# Patient Record
Sex: Male | Born: 1995
Health system: Southern US, Community
[De-identification: ages and names within clinical notes are randomized; demographics above are authoritative.]

## PROBLEM LIST (undated history)

## (undated) DIAGNOSIS — F341 Dysthymic disorder: Secondary | ICD-10-CM

## (undated) HISTORY — DX: Dysthymic disorder: F34.1

---
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
## 2018-02-13 ENCOUNTER — Ambulatory Visit: Payer: Self-pay | Admitting: Family Medicine

## 2018-03-18 ENCOUNTER — Encounter: Payer: Self-pay | Admitting: Family Medicine

## 2018-03-18 ENCOUNTER — Ambulatory Visit (INDEPENDENT_AMBULATORY_CARE_PROVIDER_SITE_OTHER): Payer: BLUE CROSS/BLUE SHIELD | Admitting: Family Medicine

## 2018-03-18 VITALS — BP 110/80 | HR 83 | Temp 97.9°F | Ht 70.5 in | Wt 150.2 lb

## 2018-03-18 DIAGNOSIS — E162 Hypoglycemia, unspecified: Secondary | ICD-10-CM

## 2018-03-18 DIAGNOSIS — Z23 Encounter for immunization: Secondary | ICD-10-CM | POA: Diagnosis not present

## 2018-03-18 DIAGNOSIS — R42 Dizziness and giddiness: Secondary | ICD-10-CM

## 2018-03-18 LAB — COMPREHENSIVE METABOLIC PANEL
ALT: 11 U/L (ref 0–53)
AST: 15 U/L (ref 0–37)
Albumin: 5 g/dL (ref 3.5–5.2)
Alkaline Phosphatase: 72 U/L (ref 39–117)
BUN: 13 mg/dL (ref 6–23)
CO2: 29 mEq/L (ref 19–32)
Calcium: 10.2 mg/dL (ref 8.4–10.5)
Chloride: 100 mEq/L (ref 96–112)
Creatinine, Ser: 1.06 mg/dL (ref 0.40–1.50)
GFR: 93.12 mL/min (ref >60.00)
Glucose, Bld: 90 mg/dL (ref 70–99)
Potassium: 4.4 mEq/L (ref 3.5–5.1)
Sodium: 139 mEq/L (ref 135–145)
TOTAL PROTEIN: 7.6 g/dL (ref 6.0–8.3)
Total Bilirubin: 1.3 mg/dL — ABNORMAL HIGH (ref 0.2–1.2)

## 2018-03-18 LAB — CBC
HCT: 48.2 % (ref 39.0–52.0)
HEMOGLOBIN: 16.5 g/dL (ref 13.0–17.0)
MCHC: 34.2 g/dL (ref 30.0–36.0)
MCV: 91.6 fl (ref 78.0–100.0)
Platelets: 251 10*3/uL (ref 150.0–400.0)
RBC: 5.26 Mil/uL (ref 4.22–5.81)
RDW: 13.2 % (ref 11.5–15.5)
WBC: 8.3 10*3/uL (ref 4.0–10.5)

## 2018-03-18 LAB — TSH: TSH: 1.72 u[IU]/mL (ref 0.35–4.50)

## 2018-03-18 NOTE — Assessment & Plan Note (Signed)
-  Unclear etiology of symptoms.  May be related to hypoglycemia.  -Discussed trying to be better about eating regular meals throughout the day.  -Focus on high protein foods -Labs ordered today as well.  Orders Placed This Encounter  Procedures  . Flu Vaccine QUAD 36+ mos IM  . Comp Met (CMET)  . TSH  . CBC  . Insulin, Free and Total

## 2018-03-18 NOTE — Patient Instructions (Signed)
Very nice to meet you today! We'll be in touch with lab results.    Protein Content in Foods Generally, most healthy people need around 50 grams of protein each day. Depending on your overall health, you may need more or less protein in your diet. Talk to your health care provider or dietitian about how much protein you need. See the following list for the protein content of some common foods. High-protein foods High-protein foods contain 4 grams (4 g) or more of protein per serving. They include:  Beef, ground sirloin (cooked) - 3 oz have 24 g of protein.  Cheese (hard) - 1 oz has 7 g of protein.  Chicken breast, boneless and skinless (cooked) - 3 oz have 13.4 g of protein.  Cottage cheese - 1/2 cup has 13.4 g of protein.  Egg - 1 egg has 6 g of protein.  Fish, filet (cooked) - 1 oz has 6-7 g of protein.  Garbanzo beans (canned or cooked) - 1/2 cup has 6-7 g of protein.  Kidney beans (canned or cooked) - 1/2 cup has 6-7 g of protein.  Lamb (cooked) - 3 oz has 24 g of protein.  Milk - 1 cup (8 oz) has 8 g of protein.  Nuts (peanuts, pistachios, almonds) - 1 oz has 6 g of protein.  Peanut butter - 1 oz has 7-8 g of protein.  Pork tenderloin (cooked) - 3 oz has 18.4 g of protein.  Pumpkin seeds - 1 oz has 8.5 g of protein.  Soybeans (roasted) - 1 oz has 8 g of protein.  Soybeans (cooked) - 1/2 cup has 11 g of protein.  Soy milk - 1 cup (8 oz) has 5-10 g of protein.  Soy or vegetable patty - 1 patty has 11 g of protein.  Sunflower seeds - 1 oz has 5.5 g of protein.  Tofu (firm) - 1/2 cup has 20 g of protein.  Tuna (canned in water) - 3 oz has 20 g of protein.  Yogurt - 6 oz has 8 g of protein.  Low-protein foods Low-protein foods contain 3 grams (3 g) or less of protein per serving. They include:  Beets (raw or cooked) - 1/2 cup has 1.5 g of protein.  Bran cereal - 1/2 cup has 2-3 g of protein.  Bread - 1 slice has 2.5 g of protein.  Broccoli (raw or  cooked) - 1/2 cup has 2 g of protein.  Collard greens (raw or cooked) - 1/2 cup has 2 g of protein.  Corn (fresh or cooked) - 1/2 cup has 2 g of protein.  Cream cheese - 1 oz has 2 g of protein.  Creamer (half-and-half) - 1 oz has 1 g of protein.  Flour tortilla - 1 tortilla has 2.5 g of protein  Frozen yogurt - 1/2 cup has 3 g of protein.  Fruit or vegetable juice - 1/2 cup has 1 g of protein.  Green beans (raw or cooked) - 1/2 cup has 1 g of protein.  Green peas (canned) - 1/2 cup has 3.5 g of protein.  Muffins - 1 small muffin (2 oz) has 3 g of protein.  Oatmeal (cooked) - 1/2 cup has 3 g of protein.  Potato (baked with skin) - 1 medium potato has 3 g of protein.  Rice (cooked) - 1/2 cup has 2.5-3.5 g of protein.  Sour cream - 1/2 cup has 2.5 g of protein.  Spinach (cooked) - 1/2 cup has 3 g of protein.  Squash (cooked) - 1/2 cup has 1.5 g of protein.  Actual amounts of protein may be different depending on processing. Talk with your health care provider or dietitian about what foods are recommended for you. This information is not intended to replace advice given to you by your health care provider. Make sure you discuss any questions you have with your health care provider. Document Released: 07/01/2015 Document Revised: 12/11/2015 Document Reviewed: 12/11/2015 Elsevier Interactive Patient Education  [employer].

## 2018-03-18 NOTE — Progress Notes (Signed)
Russell Washington - 22 y.o. male MRN [id number]  Date of birth: [birth year]  Subjective Chief Complaint  Patient presents with  . Hypoglycemia    HPI Russell Washington is a 22 y.o. male with history of dysthymia/anxiety here today to establish care with new pcp.  He is concerned about possible hypoglycemic symptoms.  He reports symptoms of feeling shaky, dizziness, and fatigue.  These usually improve with eating.  He reports that he doesn't always follow the best schedule when it comes to eating.  He typically does not eat breakfast and sometimes will skip lunch.  He does feel anxious at times as well.  He has found it difficult to put on weight.  He denies other symptoms including fever, chills, weight loss, nausea, vomiting, diarrhea, or abdominal pain.   ROS:  A comprehensive ROS was completed and negative except as noted per HPI  No Known Allergies  History reviewed. No pertinent past medical history.  History reviewed. No pertinent surgical history.  Social History   Socioeconomic History  . Marital status: Single    Spouse name: Not on file  . Number of children: Not on file  . Years of education: Not on file  . Highest education level: Not on file  Occupational History  . Not on file  Social Needs  . Financial resource strain: Not on file  . Food insecurity:    Worry: Not on file    Inability: Not on file  . Transportation needs:    Medical: Not on file    Non-medical: Not on file  Tobacco Use  . Smoking status: Never Smoker  . Smokeless tobacco: Never Used  Substance and Sexual Activity  . Alcohol use: Never    Frequency: Never  . Drug use: Never  . Sexual activity: Not on file  Lifestyle  . Physical activity:    Days per week: Not on file    Minutes per session: Not on file  . Stress: Not on file  Relationships  . Social connections:    Talks on phone: Not on file    Gets together: Not on file    Attends religious service: Not on file    Active member of  club or organization: Not on file    Attends meetings of clubs or organizations: Not on file    Relationship status: Not on file  Other Topics Concern  . Not on file  Social History Narrative  . Not on file    History reviewed. No pertinent family history.  Health Maintenance  Topic Date Due  . TETANUS/TDAP  07/09/2015  . INFLUENZA VACCINE  11/13/2017  . HIV Screening  03/19/2019 (Originally 07/09/2011)    ----------------------------------------------------------------------------------------------------------------------------------------------------------------------------------------------------------------- Physical Exam BP 110/80   Pulse 83   Temp 97.9 F (36.6 C) (Oral)   Ht 5' 10.5" (1.791 m)   Wt 150 lb 3.2 oz (68.1 kg)   SpO2 99%   BMI 21.25 kg/m   Physical Exam  Constitutional: He is oriented to person, place, and time. He appears well-nourished. No distress.  HENT:  Head: Normocephalic and atraumatic.  Mouth/Throat: Oropharynx is clear and moist.  Eyes: No scleral icterus.  Neck: Neck supple. No thyromegaly present.  Cardiovascular: Normal rate, regular rhythm and normal heart sounds.  Pulmonary/Chest: Effort normal and breath sounds normal.  Abdominal: Soft. Bowel sounds are normal.  Lymphadenopathy:    He has no cervical adenopathy.  Neurological: He is alert and oriented to person, place, and time.  Skin: Skin is warm  and dry.  Psychiatric: He has a normal mood and affect. His behavior is normal.    ------------------------------------------------------------------------------------------------------------------------------------------------------------------------------------------------------------------- Assessment and Plan  Hypoglycemia -Unclear etiology of symptoms.  May be related to hypoglycemia.  -Discussed trying to be better about eating regular meals throughout the day.  -Focus on high protein foods -Labs ordered today as well.  Orders  Placed This Encounter  Procedures  . Flu Vaccine QUAD 36+ mos IM  . Comp Met (CMET)  . TSH  . CBC  . Insulin, Free and Total

## 2018-03-22 LAB — INSULIN, FREE AND TOTAL
Free Insulin: 7.3 uU/mL
Total Insulin: 7.3 uU/mL

## 2018-03-25 ENCOUNTER — Telehealth: Payer: Self-pay | Admitting: Family Medicine

## 2018-03-25 NOTE — Telephone Encounter (Signed)
Copied from CRM [phone]. Topic: General - Other >> Mar 25, 2018 12:24 PM Lynne LoganHudson, Caryn D wrote: Reason for CRM: Pt called back to get lab results. NT unavailable. Please contact pt. [phone]#[phone]

## 2018-03-30 NOTE — Telephone Encounter (Signed)
Patient calling back to receive lab results. NT unavailable. Please contact patient.

## 2018-03-31 NOTE — Telephone Encounter (Signed)
Attempted to call patient about his lab results- left message to call back

## 2018-04-21 ENCOUNTER — Other Ambulatory Visit: Payer: Self-pay | Admitting: Family Medicine

## 2018-04-21 ENCOUNTER — Ambulatory Visit (INDEPENDENT_AMBULATORY_CARE_PROVIDER_SITE_OTHER): Payer: BLUE CROSS/BLUE SHIELD | Admitting: Family Medicine

## 2018-04-21 ENCOUNTER — Ambulatory Visit (INDEPENDENT_AMBULATORY_CARE_PROVIDER_SITE_OTHER): Payer: BLUE CROSS/BLUE SHIELD

## 2018-04-21 ENCOUNTER — Encounter: Payer: Self-pay | Admitting: Family Medicine

## 2018-04-21 VITALS — BP 128/72 | HR 66 | Temp 98.2°F | Ht 70.5 in | Wt 150.0 lb

## 2018-04-21 DIAGNOSIS — M25572 Pain in left ankle and joints of left foot: Secondary | ICD-10-CM

## 2018-04-21 DIAGNOSIS — Z0000 Encounter for general adult medical examination without abnormal findings: Secondary | ICD-10-CM | POA: Diagnosis not present

## 2018-04-21 DIAGNOSIS — S8252XA Displaced fracture of medial malleolus of left tibia, initial encounter for closed fracture: Secondary | ICD-10-CM | POA: Diagnosis not present

## 2018-04-21 DIAGNOSIS — S8255XA Nondisplaced fracture of medial malleolus of left tibia, initial encounter for closed fracture: Secondary | ICD-10-CM

## 2018-04-21 NOTE — Patient Instructions (Signed)

## 2018-04-21 NOTE — Assessment & Plan Note (Signed)
-  Xrays ordered for further evaluation -He may continue boot and crutches for now.

## 2018-04-21 NOTE — Progress Notes (Signed)
Russell Washington - 23 y.o. male MRN [id number]  Date of birth: Oct 24, 1995  Subjective Chief Complaint  Patient presents with  . Annual Exam    fell snowboarding on 04/16/18-lower left ankle pain    HPI Russell Washington is a 23 y.o. male here today for annual exam.  He also has complaint of L ankle pain.  Reports fall while snowboarding on 04/16/2018.  Did not seek care for this initially and friend had aircast boot and crutches which he has been using since initial injury.  He reports swelling and pain have improved some and he is able to weight bear some over the past day or two.  He denies numbness or tingling of the foot.    Review of Systems  Constitutional: Negative for chills, fever, malaise/fatigue and weight loss.  HENT: Negative for congestion, ear pain and sore throat.   Eyes: Negative for blurred vision, double vision and pain.  Respiratory: Negative for cough and shortness of breath.   Cardiovascular: Negative for chest pain and palpitations.  Gastrointestinal: Negative for abdominal pain, blood in stool, constipation, heartburn and nausea.  Genitourinary: Negative for dysuria and urgency.  Musculoskeletal: Positive for joint pain. Negative for myalgias.  Neurological: Negative for dizziness and headaches.  Endo/Heme/Allergies: Does not bruise/bleed easily.  Psychiatric/Behavioral: Negative for depression. The patient is not nervous/anxious and does not have insomnia.      No Known Allergies  Past Medical History:  Diagnosis Date  . Dysthymia     No past surgical history on file.  Social History   Socioeconomic History  . Marital status: Single    Spouse name: Not on file  . Number of children: Not on file  . Years of education: Not on file  . Highest education level: Not on file  Occupational History  . Not on file  Social Needs  . Financial resource strain: Not on file  . Food insecurity:    Worry: Not on file    Inability: Not on file  . Transportation  needs:    Medical: Not on file    Non-medical: Not on file  Tobacco Use  . Smoking status: Never Smoker  . Smokeless tobacco: Never Used  Substance and Sexual Activity  . Alcohol use: Never    Frequency: Never  . Drug use: Never  . Sexual activity: Not on file  Lifestyle  . Physical activity:    Days per week: Not on file    Minutes per session: Not on file  . Stress: Not on file  Relationships  . Social connections:    Talks on phone: Not on file    Gets together: Not on file    Attends religious service: Not on file    Active member of club or organization: Not on file    Attends meetings of clubs or organizations: Not on file    Relationship status: Not on file  Other Topics Concern  . Not on file  Social History Narrative  . Not on file    No family history on file.  Health Maintenance  Topic Date Due  . TETANUS/TDAP  07/09/2015  . HIV Screening  03/19/2019 (Originally 07/09/2011)  . INFLUENZA VACCINE  Completed    ----------------------------------------------------------------------------------------------------------------------------------------------------------------------------------------------------------------- Physical Exam BP 128/72   Pulse 66   Temp 98.2 F (36.8 C) (Oral)   Ht 5' 10.5" (1.791 m)   Wt 150 lb (68 kg)   SpO2 98%   BMI 21.22 kg/m   Physical Exam Constitutional:  General: He is not in acute distress. HENT:     Head: Normocephalic and atraumatic.     Right Ear: External ear normal.     Left Ear: External ear normal.     Mouth/Throat:     Mouth: Mucous membranes are moist.  Eyes:     General: No scleral icterus. Neck:     Musculoskeletal: Normal range of motion.     Thyroid: No thyromegaly.  Cardiovascular:     Rate and Rhythm: Normal rate and regular rhythm.     Heart sounds: Normal heart sounds.  Pulmonary:     Effort: Pulmonary effort is normal.     Breath sounds: Normal breath sounds.  Abdominal:     General:  Bowel sounds are normal. There is no distension.     Palpations: Abdomen is soft.     Tenderness: There is no abdominal tenderness. There is no guarding.  Musculoskeletal:     Comments: Mild swelling and bruising noted along medial ankle.  TTP along medial malleolus without deformity.  Pain with eversion/inversion.  Pain with weight bearing.   Lymphadenopathy:     Cervical: No cervical adenopathy.  Skin:    General: Skin is warm and dry.     Findings: No rash.  Neurological:     Mental Status: He is alert and oriented to person, place, and time.     Cranial Nerves: No cranial nerve deficit.     Motor: No abnormal muscle tone.  Psychiatric:        Mood and Affect: Mood normal.        Behavior: Behavior normal.     ------------------------------------------------------------------------------------------------------------------------------------------------------------------------------------------------------------------- Assessment and Plan  Acute left ankle pain -Xrays ordered for further evaluation -He may continue boot and crutches for now.     Well adult exam Well adult Previous labs reviewed Immunizations: Up to date Screenings: none indicated Anticipatory guidance/Risk factor reduction:  Per AVS

## 2018-04-21 NOTE — Assessment & Plan Note (Signed)
Well adult Previous labs reviewed Immunizations: Up to date Screenings: none indicated Anticipatory guidance/Risk factor reduction:  Per AVS

## 2018-04-22 ENCOUNTER — Telehealth: Payer: Self-pay

## 2018-04-22 NOTE — Telephone Encounter (Signed)
Spoke with patient-given results. Answered questions-he verbalized understanding

## 2018-04-22 NOTE — Telephone Encounter (Signed)
Left VM to return call 

## 2018-04-22 NOTE — Telephone Encounter (Signed)
Copied from CRM [phone] [phone]. Topic: General - Other >> Apr 21, 2018  5:11 PM Jilda Roche wrote: Reason for CRM: Pt called back said he had a message from [place] at the office, tried to call office already closed  Best call back is [phone]

## 2018-04-23 ENCOUNTER — Encounter (INDEPENDENT_AMBULATORY_CARE_PROVIDER_SITE_OTHER): Payer: Self-pay | Admitting: Orthopedic Surgery

## 2018-04-23 ENCOUNTER — Ambulatory Visit (INDEPENDENT_AMBULATORY_CARE_PROVIDER_SITE_OTHER): Payer: BLUE CROSS/BLUE SHIELD | Admitting: Orthopedic Surgery

## 2018-04-23 VITALS — Ht 70.0 in | Wt 150.0 lb

## 2018-04-23 DIAGNOSIS — S8255XA Nondisplaced fracture of medial malleolus of left tibia, initial encounter for closed fracture: Secondary | ICD-10-CM | POA: Diagnosis not present

## 2018-04-26 ENCOUNTER — Encounter (INDEPENDENT_AMBULATORY_CARE_PROVIDER_SITE_OTHER): Payer: Self-pay | Admitting: Orthopedic Surgery

## 2018-04-26 DIAGNOSIS — S8255XA Nondisplaced fracture of medial malleolus of left tibia, initial encounter for closed fracture: Secondary | ICD-10-CM | POA: Insufficient documentation

## 2018-04-26 NOTE — Progress Notes (Signed)
   Office Visit Note   Patient: Russell Washington           Date of Birth: 1996/02/20           MRN: [id number] Visit Date: 04/23/2018              Requested by: Everrett Coombe, DO [address] [place], [place] [address] PCP: Everrett Coombe, DO  Chief Complaint  Patient presents with  . Left Ankle - Pain    04/16/2018 snowboarding accident.       HPI: Patient is a 23 year old gentleman who sustained a snowboarding accident on January 2.  Patient had radiographs obtained on January 7 which showed a nondisplaced medial malleolar fracture.  Patient is seen today for initial evaluation he is nonweightbearing with crutches and a fracture boot.  Patient does not smoke.  No medications no medical problems.  Assessment & Plan: Visit Diagnoses:  1. Closed nondisplaced fracture of medial malleolus of left tibia, initial encounter     Plan: Discussed with the patient that since his radiographs a week out showed no displacement I feel this should heal well without intervention.  Recommended continue nonweightbearing use the fracture boot and crutches.  Follow-up in 3 weeks repeat three-view radiographs of the left ankle at which time anticipate we can begin weightbearing as tolerated in the boot.  Follow-Up Instructions: Return in about 3 weeks (around 05/14/2018).   Ortho Exam  Patient is alert, oriented, no adenopathy, well-dressed, normal affect, normal respiratory effort. Examination patient has a good pulse the proximal tibia and fibula are nontender to palpation the syndesmosis is nontender to palpation.  The lateral ankle ligaments are minimally tender to palpation anterior drawer is stable review of the radiographs shows a nondisplaced medial malleolar fracture.  Imaging: No results found. No images are attached to the encounter.  Labs: No results found for: HGBA1C, ESRSEDRATE, CRP, LABURIC, REPTSTATUS, GRAMSTAIN, CULT, LABORGA   Lab Results  Component Value Date   ALBUMIN 5.0 03/18/2018    Body mass index is 21.52 kg/m.  Orders:  No orders of the defined types were placed in this encounter.  No orders of the defined types were placed in this encounter.    Procedures: No procedures performed  Clinical Data: No additional findings.  ROS:  All other systems negative, except as noted in the HPI. Review of Systems  Objective: Vital Signs: Ht 5\' 10"  (1.778 m)   Wt 150 lb (68 kg)   BMI 21.52 kg/m   Specialty Comments:  No specialty comments available.  PMFS History: Patient Active Problem List   Diagnosis Date Noted  . Closed nondisplaced fracture of medial malleolus of left tibia 04/26/2018  . Acute left ankle pain 04/21/2018  . Well adult exam 04/21/2018  . Hypoglycemia 03/18/2018   Past Medical History:  Diagnosis Date  . Dysthymia     History reviewed. No pertinent family history.  History reviewed. No pertinent surgical history. Social History   Occupational History  . Not on file  Tobacco Use  . Smoking status: Never Smoker  . Smokeless tobacco: Never Used  Substance and Sexual Activity  . Alcohol use: Never    Frequency: Never  . Drug use: Never  . Sexual activity: Not on file

## 2018-05-14 ENCOUNTER — Ambulatory Visit (INDEPENDENT_AMBULATORY_CARE_PROVIDER_SITE_OTHER): Payer: BLUE CROSS/BLUE SHIELD | Admitting: Orthopedic Surgery

## 2018-05-14 ENCOUNTER — Encounter (INDEPENDENT_AMBULATORY_CARE_PROVIDER_SITE_OTHER): Payer: Self-pay | Admitting: Orthopedic Surgery

## 2018-05-14 ENCOUNTER — Ambulatory Visit (INDEPENDENT_AMBULATORY_CARE_PROVIDER_SITE_OTHER): Payer: Self-pay

## 2018-05-14 VITALS — Ht 70.0 in | Wt 150.0 lb

## 2018-05-14 DIAGNOSIS — S8255XA Nondisplaced fracture of medial malleolus of left tibia, initial encounter for closed fracture: Secondary | ICD-10-CM

## 2018-05-14 DIAGNOSIS — M25872 Other specified joint disorders, left ankle and foot: Secondary | ICD-10-CM

## 2018-05-14 MED ORDER — METHYLPREDNISOLONE ACETATE 40 MG/ML IJ SUSP
40.0000 mg | INTRAMUSCULAR | Status: AC | PRN
  Administered 2018-05-14: 40 mg via INTRA_ARTICULAR

## 2018-05-14 MED ORDER — LIDOCAINE HCL 1 % IJ SOLN
2.0000 mL | INTRAMUSCULAR | Status: AC | PRN
  Administered 2018-05-14: 2 mL

## 2018-05-14 NOTE — Progress Notes (Signed)
Office Visit Note   Patient: Russell Washington           Date of Birth: [birth year]           MRN: [id number] Visit Date: 05/14/2018              Requested by: Everrett Coombe, DO [address] [place], [place] [address] PCP: Everrett Coombe, DO  Chief Complaint  Patient presents with  . Left Ankle - Pain, Follow-up      HPI: Patient is a 23 year old gentleman who is status post a snowboarding accident about 4 weeks ago.  Patient has been nonweightbearing in a fracture boot initial radiographs showed a nondisplaced fracture through the medial malleolus.  Patient states the bone at this time is not painful but he does have pain over the deltoid ligaments.  He can weight-bear without difficulty but has pain with walking.  Assessment & Plan: Visit Diagnoses:  1. Closed nondisplaced fracture of medial malleolus of left tibia, initial encounter   2. Impingement syndrome of left ankle     Plan: Patient seems to be symptomatic from impingement of the deltoid ligament at this time ankle was injected he will increase his activities as tolerated patient was given instructions for isometric strengthening of the ankle.  Follow-Up Instructions: Return if symptoms worsen or fail to improve.   Ortho Exam  Patient is alert, oriented, no adenopathy, well-dressed, normal affect, normal respiratory effort. Examination patient has good pulse there is good ankle and subtalar motion no dystrophic changes.  The proximal tibia and fibula are nontender to palpation the syndesmosis is nontender to palpation the medial and lateral malleolus are nontender to palpation.  The lateral ankle ligaments are nontender to palpation anterior drawer is stable he is point tender to palpation over the anterior medial joint line with some mild tenderness over the deltoid with persistent symptoms from tear of the deltoid and most likely impingement from the deltoid tear.  Imaging: Xr Ankle Complete Left  Result  Date: 05/14/2018 3 view radiographs of the left ankle shows stable alignment of the medial and lateral malleolus no displacement no fracture lines visible.  The joint is congruent.  No images are attached to the encounter.  Labs: No results found for: HGBA1C, ESRSEDRATE, CRP, LABURIC, REPTSTATUS, GRAMSTAIN, CULT, LABORGA   Lab Results  Component Value Date   ALBUMIN 5.0 03/18/2018    Body mass index is 21.52 kg/m.  Orders:  Orders Placed This Encounter  Procedures  . XR Ankle Complete Left   No orders of the defined types were placed in this encounter.    Procedures: Medium Joint Inj: L ankle on 05/14/2018 10:54 AM Indications: pain and diagnostic evaluation Details: 22 G 1.5 in needle, anteromedial approach Medications: 2 mL lidocaine 1 %; 40 mg methylPREDNISolone acetate 40 MG/ML Outcome: tolerated well, no immediate complications Procedure, treatment alternatives, risks and benefits explained, specific risks discussed. Consent was given by the patient. Immediately prior to procedure a time out was called to verify the correct patient, procedure, equipment, support staff and site/side marked as required. Patient was prepped and draped in the usual sterile fashion.      Clinical Data: No additional findings.  ROS:  All other systems negative, except as noted in the HPI. Review of Systems  Objective: Vital Signs: Ht 5\' 10"  (1.778 m)   Wt 150 lb (68 kg)   BMI 21.52 kg/m   Specialty Comments:  No specialty comments available.  PMFS History: Patient Active Problem  List   Diagnosis Date Noted  . Closed nondisplaced fracture of medial malleolus of left tibia 04/26/2018  . Acute left ankle pain 04/21/2018  . Well adult exam 04/21/2018  . Hypoglycemia 03/18/2018   Past Medical History:  Diagnosis Date  . Dysthymia     History reviewed. No pertinent family history.  History reviewed. No pertinent surgical history. Social History   Occupational History  .  Not on file  Tobacco Use  . Smoking status: Never Smoker  . Smokeless tobacco: Never Used  Substance and Sexual Activity  . Alcohol use: Never    Frequency: Never  . Drug use: Never  . Sexual activity: Not on file

## 2019-03-30 DIAGNOSIS — Z20828 Contact with and (suspected) exposure to other viral communicable diseases: Secondary | ICD-10-CM | POA: Diagnosis not present

## 2019-04-14 DIAGNOSIS — Z20828 Contact with and (suspected) exposure to other viral communicable diseases: Secondary | ICD-10-CM | POA: Diagnosis not present

## 2019-04-14 IMAGING — DX DG ANKLE COMPLETE 3+V*L*
3 series · 3 of 3 positions shown · non-contrast
Comparison: None.

CLINICAL DATA: Initial visit for left ankle pain following a fall
while snowboarding 5 days ago. Pt states pain is more medial

EXAM:
LEFT ANKLE COMPLETE - 3+ VIEW

[ankle ap]
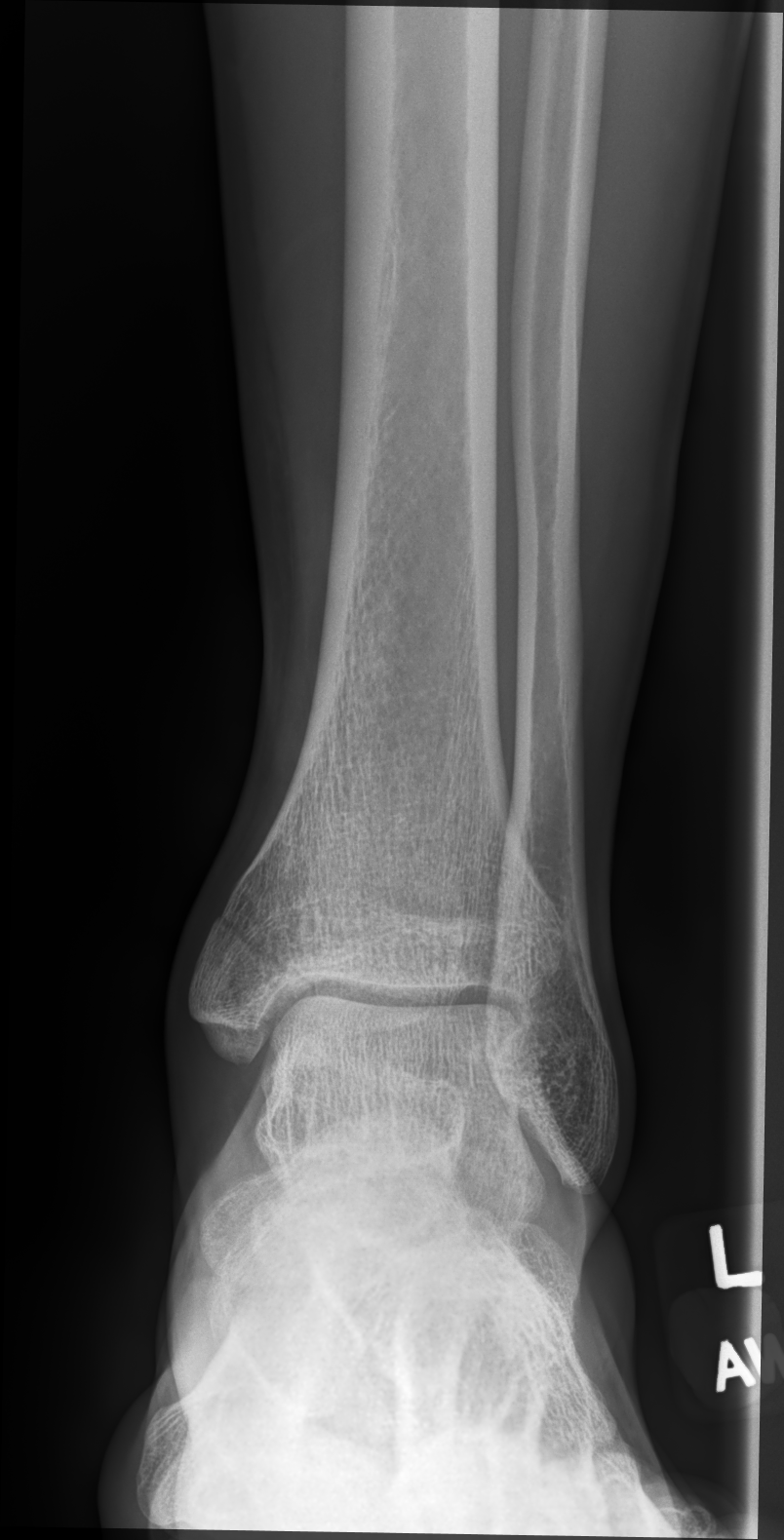

[ankle mlo]
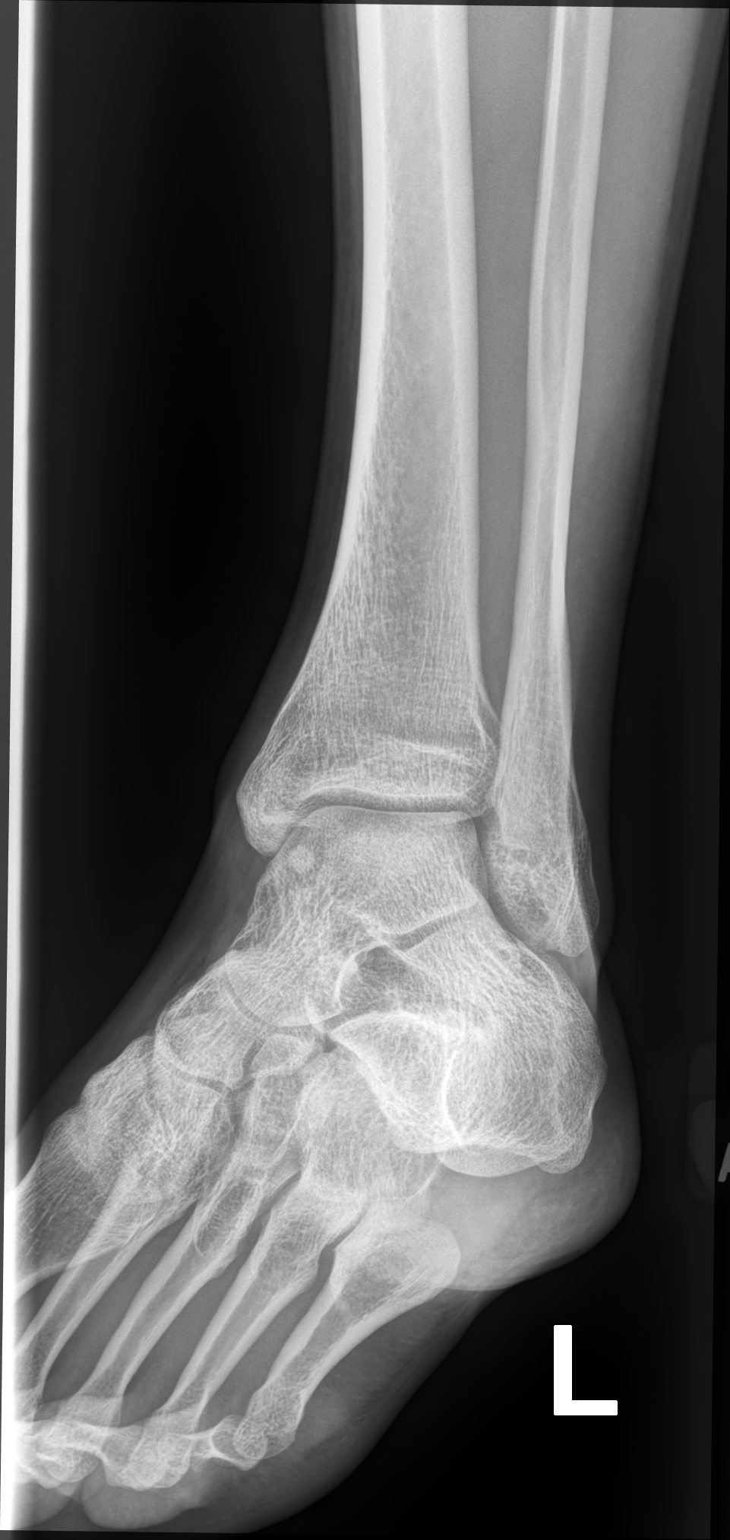

[ankle lat]
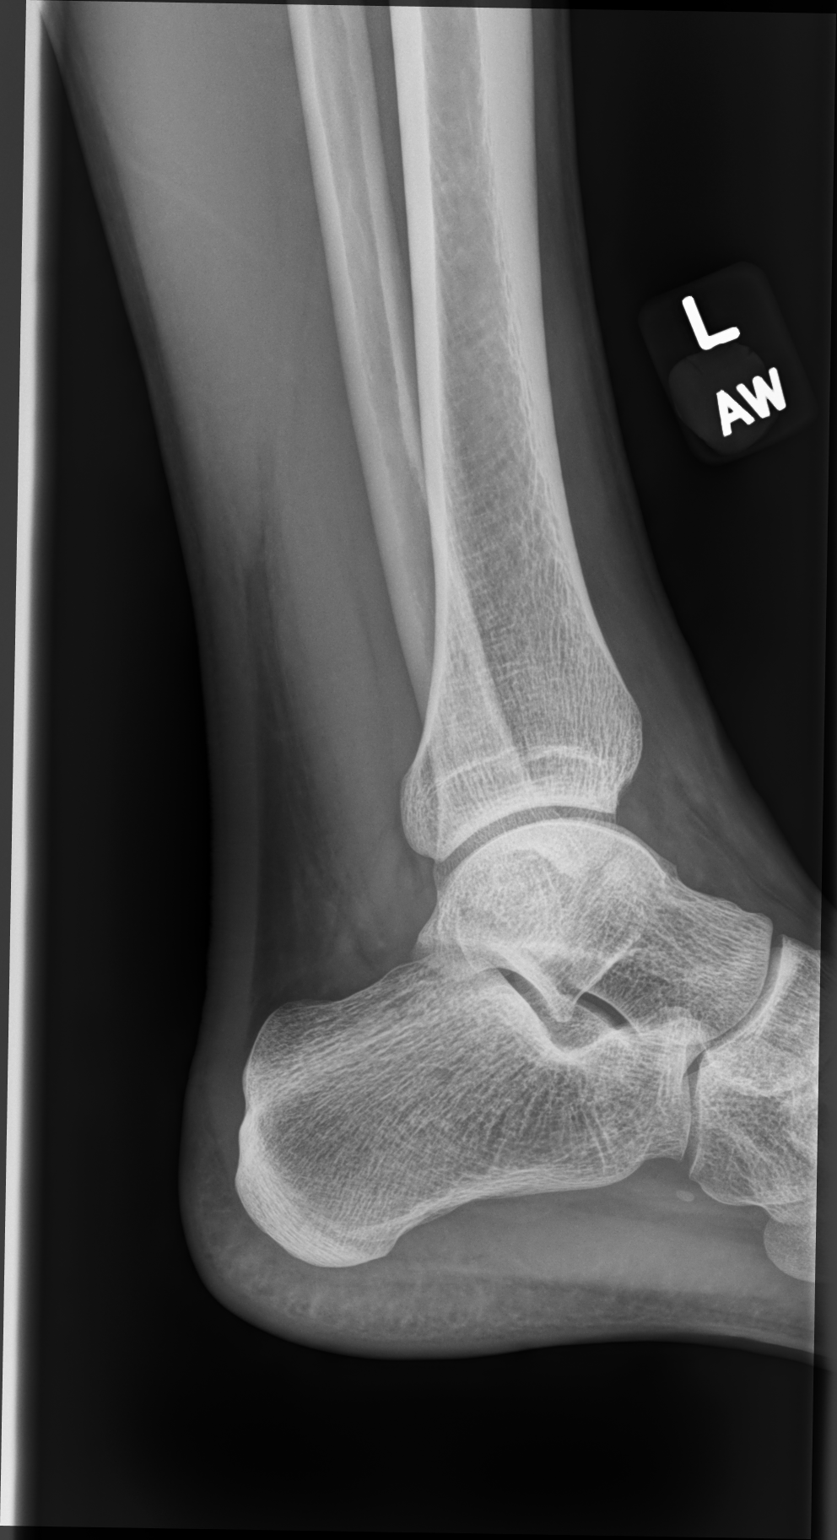

[3 of 3 positions shown; findings below may reference images not displayed]

FINDINGS: Oblique fracture across the medial malleolus, distracted
approximately 1 mm. Mild adjacent soft tissue swelling. No other
acute bone abnormalities. Ankle mortise preserved. Normal
mineralization. No significant osseous degenerative change.
IMPRESSION: Minimally distracted medial malleolar fracture.

## 2019-08-27 ENCOUNTER — Encounter: Payer: Self-pay | Admitting: Family Medicine

## 2019-08-27 ENCOUNTER — Telehealth (INDEPENDENT_AMBULATORY_CARE_PROVIDER_SITE_OTHER): Payer: BC Managed Care – PPO | Admitting: Family Medicine

## 2019-08-27 VITALS — Ht 70.0 in | Wt 143.0 lb

## 2019-08-27 DIAGNOSIS — J301 Allergic rhinitis due to pollen: Secondary | ICD-10-CM | POA: Insufficient documentation

## 2019-08-27 DIAGNOSIS — J0100 Acute maxillary sinusitis, unspecified: Secondary | ICD-10-CM | POA: Insufficient documentation

## 2019-08-27 MED ORDER — SALINE SPRAY 0.65 % NA SOLN: 1.0000 | NASAL | 2 refills | Status: DC | PRN

## 2019-08-27 MED ORDER — MOMETASONE FUROATE 50 MCG/ACT NA SUSP: 2.0000 | Freq: Every day | NASAL | 2 refills | Status: DC

## 2019-08-27 MED ORDER — AMOXICILLIN 500 MG PO CAPS: 500.0000 mg | ORAL_CAPSULE | Freq: Three times a day (TID) | ORAL | 0 refills | Status: DC

## 2019-08-27 NOTE — Progress Notes (Signed)
Established Patient Office Visit  Subjective:  Patient ID: Russell Washington, male    DOB: June 07, 1995  Age: 24 y.o. MRN: [id number]  CC:  Chief Complaint  Patient presents with  . Sinusitis    sinus pressure, nasal congestion, thick green mucus x 3-4 weeks.     HPI Russell Washington presents for evaluation and treatment of possible sinus infection.  Symptoms have persisted for 3 to 4 weeks.  He is now with facial pressure postnasal drip nasal congestion and purulent rhinorrhea.  He has had a mild frontal headache.  There is been no fever chills significant cough or wheezing.  Ongoing history of springtime allergies.  He asks if there is something he can do to prevent this.  He does not smoke. On a side note he tells that he experiences extreme facial flushing when he goes out and consumes alcohol with his friends.  He is not sure what it is that is from the Ramar line.  There is no difficulty breathing or swelling in his throat.  The flushing resolves after a few hours.  Past Medical History:  Diagnosis Date  . Dysthymia     No past surgical history on file.  No family history on file.  Social History   Socioeconomic History  . Marital status: Single    Spouse name: Not on file  . Number of children: Not on file  . Years of education: Not on file  . Highest education level: Not on file  Occupational History  . Not on file  Tobacco Use  . Smoking status: Never Smoker  . Smokeless tobacco: Never Used  Substance and Sexual Activity  . Alcohol use: Never  . Drug use: Never  . Sexual activity: Not on file  Other Topics Concern  . Not on file  Social History Narrative  . Not on file   Social Determinants of Health   Financial Resource Strain:   . Difficulty of Paying Living Expenses:   Food Insecurity:   . Worried About Programme researcher, broadcasting/film/video in the Last Year:   . Barista in the Last Year:   Transportation Needs:   . Freight forwarder (Medical):   Marland Kitchen Lack of  Transportation (Non-Medical):   Physical Activity:   . Days of Exercise per Week:   . Minutes of Exercise per Session:   Stress:   . Feeling of Stress :   Social Connections:   . Frequency of Communication with Friends and Family:   . Frequency of Social Gatherings with Friends and Family:   . Attends Religious Services:   . Active Member of Clubs or Organizations:   . Attends Banker Meetings:   Marland Kitchen Marital Status:   Intimate Partner Violence:   . Fear of Current or Ex-Partner:   . Emotionally Abused:   Marland Kitchen Physically Abused:   . Sexually Abused:     No outpatient medications prior to visit.   No facility-administered medications prior to visit.    No Known Allergies  ROS Review of Systems  Constitutional: Negative for chills, diaphoresis, fatigue, fever and unexpected weight change.  HENT: Positive for congestion, postnasal drip, rhinorrhea, sinus pressure and sinus pain. Negative for sneezing and voice change.   Eyes: Negative for photophobia and visual disturbance.  Respiratory: Negative.   Cardiovascular: Negative.   Gastrointestinal: Negative.   Musculoskeletal: Negative for gait problem and joint swelling.  Neurological: Positive for headaches.  Hematological: Does not bruise/bleed easily.  Psychiatric/Behavioral: Negative.  Objective:    Physical Exam  Constitutional: He appears well-developed and well-nourished. No distress.  HENT:  Head: Normocephalic and atraumatic.  Right Ear: External ear normal.  Left Ear: External ear normal.  Eyes: Conjunctivae are normal. Right eye exhibits no discharge. Left eye exhibits no discharge. No scleral icterus.  Neck: No JVD present. No tracheal deviation present.  Pulmonary/Chest: Effort normal. No stridor.  Neurological: He is alert.  Skin: Skin is warm and dry. He is not diaphoretic.  Psychiatric: He has a normal mood and affect. His behavior is normal.    Ht 5\' 10"  (1.778 m)   Wt 143 lb (64.9 kg)  Comment: per pt  BMI 20.52 kg/m  Wt Readings from Last 3 Encounters:  08/27/19 143 lb (64.9 kg)  05/14/18 150 lb (68 kg)  04/23/18 150 lb (68 kg)     Health Maintenance Due  Topic Date Due  . HIV Screening  Never done  . TETANUS/TDAP  Never done    There are no preventive care reminders to display for this patient.  Lab Results  Component Value Date   TSH 1.72 03/18/2018   Lab Results  Component Value Date   WBC 8.3 03/18/2018   HGB 16.5 03/18/2018   HCT 48.2 03/18/2018   MCV 91.6 03/18/2018   PLT 251.0 03/18/2018   Lab Results  Component Value Date   NA 139 03/18/2018   K 4.4 03/18/2018   CO2 29 03/18/2018   GLUCOSE 90 03/18/2018   BUN 13 03/18/2018   CREATININE 1.06 03/18/2018   BILITOT 1.3 (H) 03/18/2018   ALKPHOS 72 03/18/2018   AST 15 03/18/2018   ALT 11 03/18/2018   PROT 7.6 03/18/2018   ALBUMIN 5.0 03/18/2018   CALCIUM 10.2 03/18/2018   GFR 93.12 03/18/2018   No results found for: CHOL No results found for: HDL No results found for: LDLCALC No results found for: TRIG No results found for: CHOLHDL No results found for: HGBA1C    Assessment & Plan:   Problem List Items Addressed This Visit      Respiratory   Acute maxillary sinusitis - Primary   Relevant Medications   amoxicillin (AMOXIL) 500 MG capsule   mometasone (NASONEX) 50 MCG/ACT nasal spray   sodium chloride (OCEAN) 0.65 % SOLN nasal spray   Seasonal allergic rhinitis due to pollen   Relevant Medications   mometasone (NASONEX) 50 MCG/ACT nasal spray   sodium chloride (OCEAN) 0.65 % SOLN nasal spray      Meds ordered this encounter  Medications  . amoxicillin (AMOXIL) 500 MG capsule    Sig: Take 1 capsule (500 mg total) by mouth 3 (three) times daily.    Dispense:  30 capsule    Refill:  0  . mometasone (NASONEX) 50 MCG/ACT nasal spray    Sig: Place 2 sprays into the nose daily.    Dispense:  17 g    Refill:  2  . sodium chloride (OCEAN) 0.65 % SOLN nasal spray    Sig:  Place 1 spray into both nostrils as needed for congestion.    Dispense:  104 mL    Refill:  2    Follow-up: Return if symptoms worsen or fail to improve.   Will return fasting for physical exam.  They discussed the reaction he had as noted above.   Libby Maw, MD   Virtual Visit via Video Note  I connected with Russell Washington on 08/27/19 at 10:00 AM EDT by a video enabled  telemedicine application and verified that I am speaking with the correct person using two identifiers.  Location: Patient: Home alone in room.  Provider:   I discussed the limitations of evaluation and management by telemedicine and the availability of in person appointments. The patient expressed understanding and agreed to proceed.  History of Present Illness:    Observations/Objective:   Assessment and Plan:   Follow Up Instructions:    I discussed the assessment and treatment plan with the patient. The patient was provided an opportunity to ask questions and all were answered. The patient agreed with the plan and demonstrated an understanding of the instructions.   The patient was advised to call back or seek an in-person evaluation if the symptoms worsen or if the condition fails to improve as anticipated.  I provided 23 minutes of non-face-to-face time during this encounter.   Mliss Sax, MD

## 2019-10-22 DIAGNOSIS — Z20822 Contact with and (suspected) exposure to covid-19: Secondary | ICD-10-CM | POA: Diagnosis not present

## 2020-02-14 DIAGNOSIS — Z20822 Contact with and (suspected) exposure to covid-19: Secondary | ICD-10-CM | POA: Diagnosis not present

## 2020-02-14 DIAGNOSIS — R059 Cough, unspecified: Secondary | ICD-10-CM | POA: Diagnosis not present

## 2020-02-14 DIAGNOSIS — J029 Acute pharyngitis, unspecified: Secondary | ICD-10-CM | POA: Diagnosis not present

## 2020-05-25 ENCOUNTER — Other Ambulatory Visit: Payer: Self-pay

## 2020-05-25 ENCOUNTER — Ambulatory Visit: Payer: Self-pay | Admitting: Family Medicine

## 2020-06-01 ENCOUNTER — Ambulatory Visit (INDEPENDENT_AMBULATORY_CARE_PROVIDER_SITE_OTHER): Payer: BC Managed Care – PPO | Admitting: Family Medicine

## 2020-06-01 ENCOUNTER — Encounter: Payer: Self-pay | Admitting: Family Medicine

## 2020-06-01 ENCOUNTER — Other Ambulatory Visit: Payer: Self-pay

## 2020-06-01 VITALS — BP 105/89 | HR 81 | Temp 98.3°F | Ht 69.88 in | Wt 154.8 lb

## 2020-06-01 DIAGNOSIS — Z0000 Encounter for general adult medical examination without abnormal findings: Secondary | ICD-10-CM

## 2020-06-01 DIAGNOSIS — Z13220 Encounter for screening for lipoid disorders: Secondary | ICD-10-CM | POA: Diagnosis not present

## 2020-06-01 DIAGNOSIS — Z113 Encounter for screening for infections with a predominantly sexual mode of transmission: Secondary | ICD-10-CM | POA: Diagnosis not present

## 2020-06-01 NOTE — Progress Notes (Signed)
Russell Washington - 25 y.o. male MRN [id number]  Date of birth: 17-Sep-1995  Subjective Chief Complaint  Patient presents with  . Establish Care    HPI Russell Washington is a 25 y.o. male here today for annual exam.  He has been in good health overall.  He had some back pain a couple of weeks ago.  Located in mid back.  Pain was non-radicular and has resolved now.  Had some urinary frequency but no pain.   He stays quite active and feels like diet is pretty healthy.   He is a non-smoker.  He does vape occasionally.  He does consume EtOH a few times per week.   He would like to have updated labs today including STI screening.   Review of Systems  Constitutional: Negative for chills, fever, malaise/fatigue and weight loss.  HENT: Negative for congestion, ear pain and sore throat.   Eyes: Negative for blurred vision, double vision and pain.  Respiratory: Negative for cough and shortness of breath.   Cardiovascular: Negative for chest pain and palpitations.  Gastrointestinal: Negative for abdominal pain, blood in stool, constipation, heartburn and nausea.  Genitourinary: Negative for dysuria and urgency.  Musculoskeletal: Negative for joint pain and myalgias.  Neurological: Negative for dizziness and headaches.  Endo/Heme/Allergies: Does not bruise/bleed easily.  Psychiatric/Behavioral: Negative for depression. The patient is not nervous/anxious and does not have insomnia.     No Known Allergies  Past Medical History:  Diagnosis Date  . Dysthymia     History reviewed. No pertinent surgical history.  Social History   Socioeconomic History  . Marital status: Single    Spouse name: Not on file  . Number of children: Not on file  . Years of education: Not on file  . Highest education level: Not on file  Occupational History  . Not on file  Tobacco Use  . Smoking status: Never Smoker  . Smokeless tobacco: Never Used  Vaping Use  . Vaping Use: Every day  . Substances: THC   Substance and Sexual Activity  . Alcohol use: Yes    Alcohol/week: 3.0 standard drinks    Types: 3 Standard drinks or equivalent per week  . Drug use: Yes    Comment: THC  . Sexual activity: Yes    Birth control/protection: Condom  Other Topics Concern  . Not on file  Social History Narrative  . Not on file   Social Determinants of Health   Financial Resource Strain: Not on file  Food Insecurity: Not on file  Transportation Needs: Not on file  Physical Activity: Not on file  Stress: Not on file  Social Connections: Not on file    Family History  Problem Relation Age of Onset  . Diabetes Father   . Diabetes Paternal Grandfather     Health Maintenance  Topic Date Due  . Hepatitis C Screening  Never done  . HIV Screening  Never done  . TETANUS/TDAP  07/26/2017  . INFLUENZA VACCINE  11/14/2019     ----------------------------------------------------------------------------------------------------------------------------------------------------------------------------------------------------------------- Physical Exam BP 105/89 (BP Location: Left Arm, Patient Position: Sitting, Cuff Size: Normal)   Pulse 81   Temp 98.3 F (36.8 C)   Ht 5' 9.88" (1.775 m)   Wt 154 lb 12.8 oz (70.2 kg)   SpO2 98%   BMI 22.29 kg/m   Physical Exam Constitutional:      General: He is not in acute distress.    Appearance: He is well-nourished.  HENT:     Head: Normocephalic  and atraumatic.     Right Ear: Tympanic membrane and external ear normal.     Left Ear: Tympanic membrane and external ear normal.     Mouth/Throat:     Mouth: Oropharynx is clear and moist.  Eyes:     General: No scleral icterus. Neck:     Thyroid: No thyromegaly.  Cardiovascular:     Rate and Rhythm: Normal rate and regular rhythm.     Pulses: Intact distal pulses.     Heart sounds: Normal heart sounds.  Pulmonary:     Effort: Pulmonary effort is normal.     Breath sounds: Normal breath sounds.   Abdominal:     General: Bowel sounds are normal. There is no distension.     Palpations: Abdomen is soft.     Tenderness: There is no abdominal tenderness. There is no guarding.  Musculoskeletal:        General: No edema.     Cervical back: Normal range of motion.  Lymphadenopathy:     Cervical: No cervical adenopathy.  Skin:    General: Skin is warm and dry.     Findings: No rash.  Neurological:     Mental Status: He is alert and oriented to person, place, and time.     Cranial Nerves: No cranial nerve deficit.     Motor: No abnormal muscle tone.  Psychiatric:        Mood and Affect: Mood and affect and mood normal.        Behavior: Behavior normal.     ------------------------------------------------------------------------------------------------------------------------------------------------------------------------------------------------------------------- Assessment and Plan  Well adult exam Well adult Orders Placed This Encounter  Procedures  . Chlamydia/Neisseria Gonorrhoeae RNA,TMA,Urogenital  . COMPLETE METABOLIC PANEL WITH GFR  . CBC  . Lipid Profile  . HIV antibody (with reflex)  . RPR  . Urinalysis, Routine w reflex microscopic  Screening: Per orders Immunizations: Had COVID vaccine, declines flu Anticipatory guidance/Risk factor reduction:  Recommendations per AVS.    No orders of the defined types were placed in this encounter.   No follow-ups on file.    This visit occurred during the SARS-CoV-2 public health emergency.  Safety protocols were in place, including screening questions prior to the visit, additional usage of staff PPE, and extensive cleaning of exam room while observing appropriate contact time as indicated for disinfecting solutions.

## 2020-06-01 NOTE — Assessment & Plan Note (Signed)
Well adult Orders Placed This Encounter  Procedures  . Chlamydia/Neisseria Gonorrhoeae RNA,TMA,Urogenital  . COMPLETE METABOLIC PANEL WITH GFR  . CBC  . Lipid Profile  . HIV antibody (with reflex)  . RPR  . Urinalysis, Routine w reflex microscopic  Screening: Per orders Immunizations: Had COVID vaccine, declines flu Anticipatory guidance/Risk factor reduction:  Recommendations per AVS.

## 2020-06-01 NOTE — Patient Instructions (Signed)
Preventive Care 21-24 Years Old, Male Preventive care refers to lifestyle choices and visits with your health care provider that can promote health and wellness. This includes:  A yearly physical exam. This is also called an annual wellness visit.  Regular dental and eye exams.  Immunizations.  Screening for certain conditions.  Healthy lifestyle choices, such as: ? Eating a healthy diet. ? Getting regular exercise. ? Not using drugs or products that contain nicotine and tobacco. ? Limiting alcohol use. What can I expect for my preventive care visit? Physical exam Your health care provider may check your:  Height and weight. These may be used to calculate your BMI (body mass index). BMI is a measurement that tells if you are at a healthy weight.  Heart rate and blood pressure.  Body temperature.  Skin for abnormal spots. Counseling Your health care provider may ask you questions about your:  Past medical problems.  Family's medical history.  Alcohol, tobacco, and drug use.  Emotional well-being.  Home life and relationship well-being.  Sexual activity.  Diet, exercise, and sleep habits.  Work and work environment.  Access to firearms. What immunizations do I need? Vaccines are usually given at various ages, according to a schedule. Your health care provider will recommend vaccines for you based on your age, medical history, and lifestyle or other factors, such as travel or where you work.   What tests do I need? Blood tests  Lipid and cholesterol levels. These may be checked every 5 years starting at age 20.  Hepatitis C test.  Hepatitis B test. Screening  Diabetes screening. This is done by checking your blood sugar (glucose) after you have not eaten for a while (fasting).  Genital exam to check for testicular cancer or hernias.  STD (sexually transmitted disease) testing, if you are at risk. Talk with your health care provider about your test results,  treatment options, and if necessary, the need for more tests.   Follow these instructions at home: Eating and drinking  Eat a healthy diet that includes fresh fruits and vegetables, whole grains, lean protein, and low-fat dairy products.  Drink enough fluid to keep your urine pale yellow.  Take vitamin and mineral supplements as recommended by your health care provider.  Do not drink alcohol if your health care provider tells you not to drink.  If you drink alcohol: ? Limit how much you have to 0-2 drinks a day. ? Be aware of how much alcohol is in your drink. In the [place] one drink equals one 12 oz bottle of beer (355 mL), one 5 oz glass of wine (148 mL), or one 1 oz glass of hard liquor (44 mL).   Lifestyle  Take daily care of your teeth and gums. Brush your teeth every morning and night with fluoride toothpaste. Floss one time each day.  Stay active. Exercise for at least 30 minutes 5 or more days each week.  Do not use any products that contain nicotine or tobacco, such as cigarettes, e-cigarettes, and chewing tobacco. If you need help quitting, ask your health care provider.  Do not use drugs.  If you are sexually active, practice safe sex. Use a condom or other form of protection to prevent STIs (sexually transmitted infections).  Find healthy ways to cope with stress, such as: ? Meditation, yoga, or listening to music. ? Journaling. ? Talking to a trusted person. ? Spending time with friends and family. Safety  Always wear your seat belt while driving   or riding in a vehicle.  Do not drive: ? If you have been drinking alcohol. Do not ride with someone who has been drinking. ? When you are tired or distracted. ? While texting.  Wear a helmet and other protective equipment during sports activities.  If you have firearms in your house, make sure you follow all gun safety procedures.  Seek help if you have been physically or sexually abused. What's next?  Go to your  health care provider once a year for an annual wellness visit.  Ask your health care provider how often you should have your eyes and teeth checked.  Stay up to date on all vaccines. This information is not intended to replace advice given to you by your health care provider. Make sure you discuss any questions you have with your health care provider. Document Revised: 12/16/2018 Document Reviewed: 03/26/2018 Elsevier Patient Education  2021 [employer]  

## 2020-06-02 LAB — COMPLETE METABOLIC PANEL WITH GFR
BUN: 16 mg/dL (ref 7–25)
Chloride: 101 mmol/L (ref 98–110)
Total Bilirubin: 1.6 mg/dL — ABNORMAL HIGH (ref 0.2–1.2)

## 2020-06-05 LAB — URINALYSIS, ROUTINE W REFLEX MICROSCOPIC
Bilirubin Urine: NEGATIVE
Glucose, UA: NEGATIVE
Hgb urine dipstick: NEGATIVE
Ketones, ur: NEGATIVE
Leukocytes,Ua: NEGATIVE
Nitrite: NEGATIVE
Protein, ur: NEGATIVE
Specific Gravity, Urine: 1.014 (ref 1.001–1.03)
pH: 7 (ref 5.0–8.0)

## 2020-06-05 LAB — CBC
HCT: 46.7 % (ref 38.5–50.0)
Hemoglobin: 16.5 g/dL (ref 13.2–17.1)
MCH: 31.8 pg (ref 27.0–33.0)
MCHC: 35.3 g/dL (ref 32.0–36.0)
MCV: 90 fL (ref 80.0–100.0)
MPV: 9.9 fL (ref 7.5–12.5)
Platelets: 275 10*3/uL (ref 140–400)
RBC: 5.19 10*6/uL (ref 4.20–5.80)
RDW: 12.6 % (ref 11.0–15.0)
WBC: 9.8 10*3/uL (ref 3.8–10.8)

## 2020-06-05 LAB — RPR: RPR Ser Ql: NONREACTIVE

## 2020-06-05 LAB — COMPLETE METABOLIC PANEL WITH GFR
AG Ratio: 2.2 (calc) (ref 1.0–2.5)
ALT: 22 U/L (ref 9–46)
AST: 21 U/L (ref 10–40)
Albumin: 4.8 g/dL (ref 3.6–5.1)
Alkaline phosphatase (APISO): 69 U/L (ref 36–130)
CO2: 30 mmol/L (ref 20–32)
Calcium: 9.7 mg/dL (ref 8.6–10.3)
Creat: 1.13 mg/dL (ref 0.60–1.35)
GFR, Est African American: 106 mL/min/{1.73_m2} (ref >=60)
GFR, Est Non African American: 91 mL/min/{1.73_m2} (ref >=60)
Globulin: 2.2 g/dL (calc) (ref 1.9–3.7)
Glucose, Bld: 65 mg/dL (ref 65–139)
Potassium: 4.3 mmol/L (ref 3.5–5.3)
Sodium: 139 mmol/L (ref 135–146)
Total Protein: 7 g/dL (ref 6.1–8.1)

## 2020-06-05 LAB — LIPID PANEL
Cholesterol: 119 mg/dL (ref <200)
HDL: 57 mg/dL (ref >=40)
LDL Cholesterol (Calc): 45 mg/dL (calc)
Non-HDL Cholesterol (Calc): 62 mg/dL (calc) (ref <130)
Total CHOL/HDL Ratio: 2.1 (calc) (ref <5.0)
Triglycerides: 85 mg/dL (ref <150)

## 2020-06-05 LAB — HIV ANTIBODY (ROUTINE TESTING W REFLEX): HIV 1&2 Ab, 4th Generation: NONREACTIVE

## 2020-06-05 LAB — CHLAMYDIA/NEISSERIA GONORRHOEAE RNA,TMA,UROGENTIAL

## 2020-06-07 ENCOUNTER — Other Ambulatory Visit: Payer: Self-pay | Admitting: Family Medicine

## 2020-06-07 DIAGNOSIS — Z13220 Encounter for screening for lipoid disorders: Secondary | ICD-10-CM | POA: Diagnosis not present

## 2020-06-07 DIAGNOSIS — Z0000 Encounter for general adult medical examination without abnormal findings: Secondary | ICD-10-CM | POA: Diagnosis not present

## 2020-06-07 DIAGNOSIS — Z113 Encounter for screening for infections with a predominantly sexual mode of transmission: Secondary | ICD-10-CM | POA: Diagnosis not present

## 2020-06-09 LAB — CHLAMYDIA/NEISSERIA GONORRHOEAE RNA,TMA,UROGENTIAL
C. trachomatis RNA, TMA: NOT DETECTED
N. gonorrhoeae RNA, TMA: NOT DETECTED

## 2021-07-23 DIAGNOSIS — R5383 Other fatigue: Secondary | ICD-10-CM | POA: Diagnosis not present

## 2021-07-23 DIAGNOSIS — M549 Dorsalgia, unspecified: Secondary | ICD-10-CM | POA: Diagnosis not present

## 2021-07-23 DIAGNOSIS — R0789 Other chest pain: Secondary | ICD-10-CM | POA: Diagnosis not present

## 2021-07-23 DIAGNOSIS — Z7689 Persons encountering health services in other specified circumstances: Secondary | ICD-10-CM | POA: Diagnosis not present
